# Patient Record
Sex: Female | Born: 2000 | Race: White | Hispanic: No | Marital: Single | Smoking: Current every day smoker
Health system: Southern US, Community
[De-identification: ages and names within clinical notes are randomized; demographics above are authoritative.]

## PROBLEM LIST (undated history)

## (undated) DIAGNOSIS — N83209 Unspecified ovarian cyst, unspecified side: Secondary | ICD-10-CM

## (undated) DIAGNOSIS — N200 Calculus of kidney: Secondary | ICD-10-CM

---
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
## 2019-08-29 IMAGING — CT CT ABD-PELV W/ CM
2 of 4 series · 17 of 46 positions shown, 19 images · IV contrast (omnipaque)
Comparison: None.

CLINICAL DATA: 17-year-old female with bloody stool, abdominal
pain, and nausea.

EXAM:
CT ABDOMEN AND PELVIS WITH CONTRAST
TECHNIQUE: Multidetector CT imaging of the abdomen and pelvis was performed
using the standard protocol following bolus administration of
intravenous contrast.
CONTRAST:  100mL OMNIPAQUE IOHEXOL 300 MG/ML  SOLN

[Series 3: a/p w/ 5mm · axial · 0.98mm/px · z∈[+797,+1307]mm · 14 of 112 slices shown, 16 images]
[im 5/112  soft-tissue]
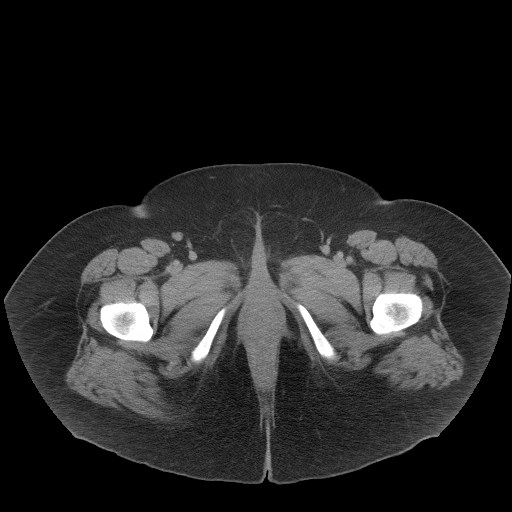
[im 5/112  bone]
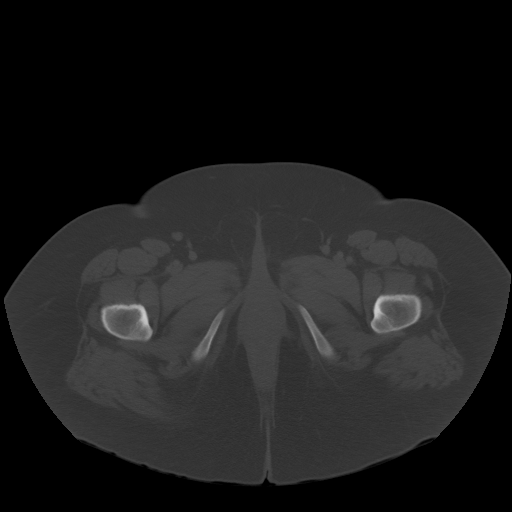
[im 13/112  soft-tissue]
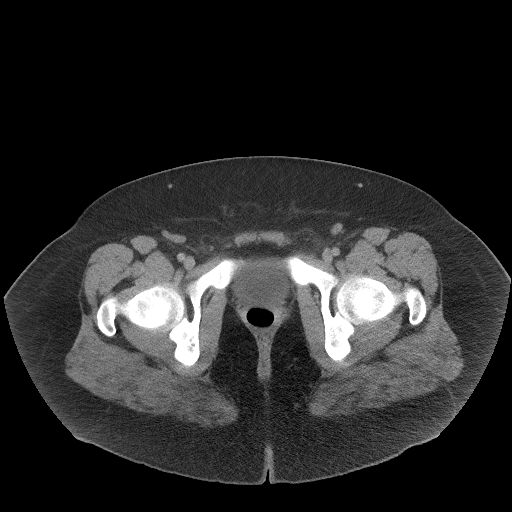
[im 22/112  soft-tissue]
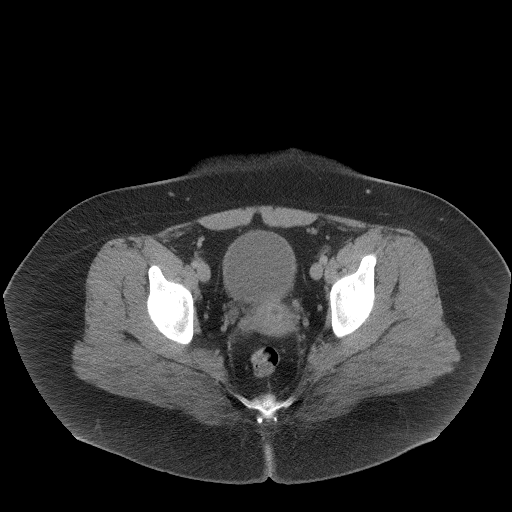
[im 30/112  soft-tissue]
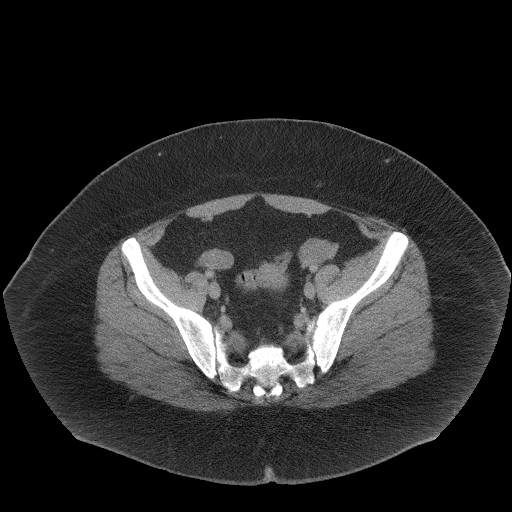
[im 39/112  soft-tissue]
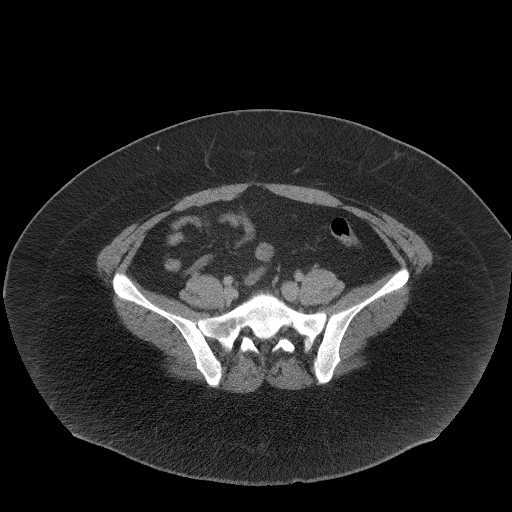
[im 43/112  soft-tissue]
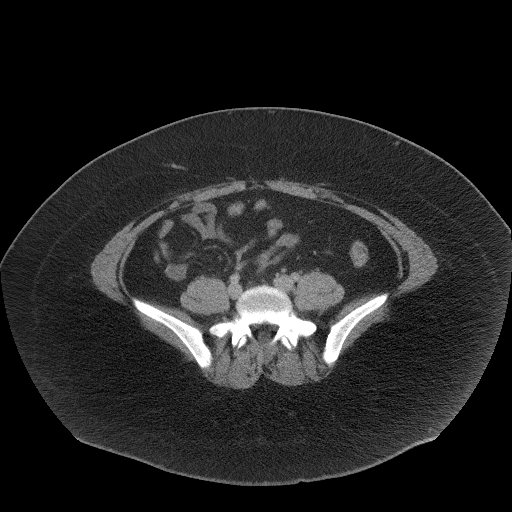
[im 52/112  soft-tissue]
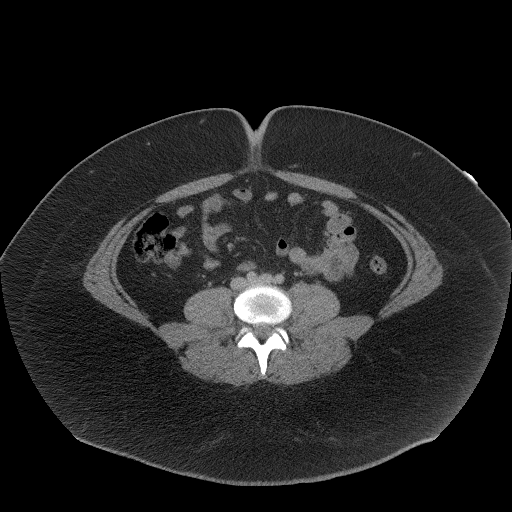
[im 60/112  soft-tissue]
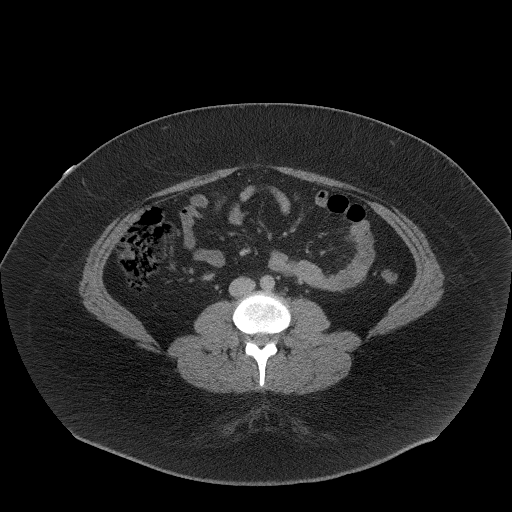
[im 69/112  soft-tissue]
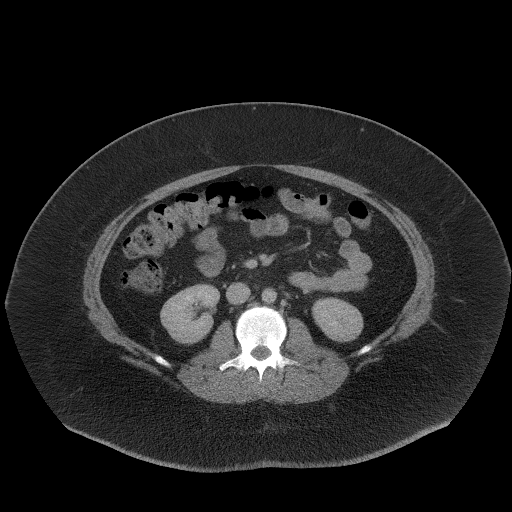
[im 69/112  bone]
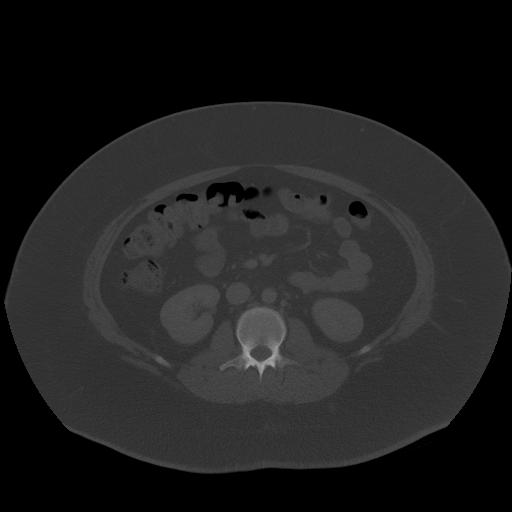
[im 73/112  soft-tissue]
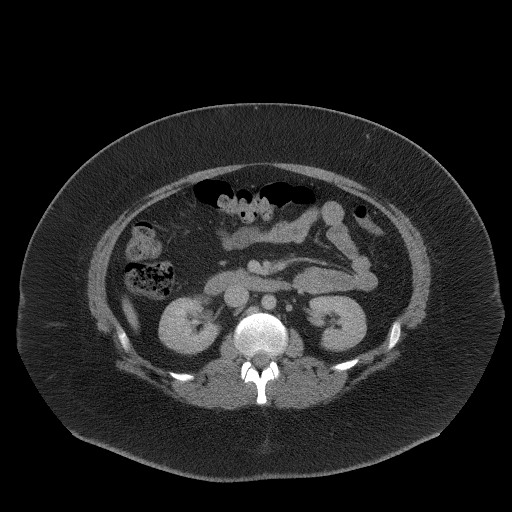
[im 82/112  soft-tissue]
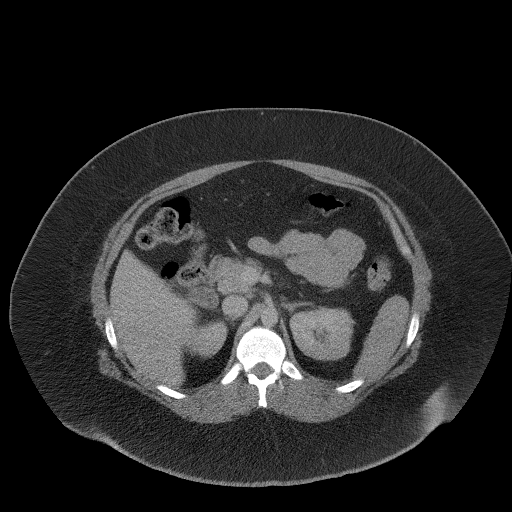
[im 90/112  soft-tissue]
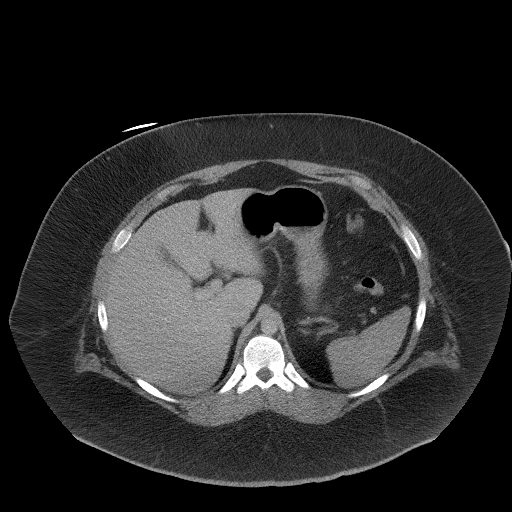
[im 99/112  soft-tissue]
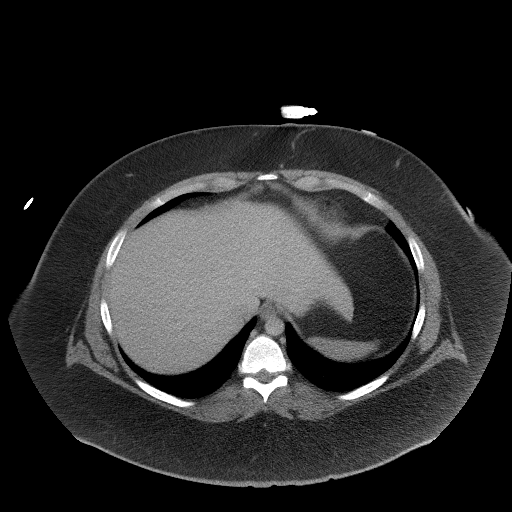
[im 107/112  soft-tissue]
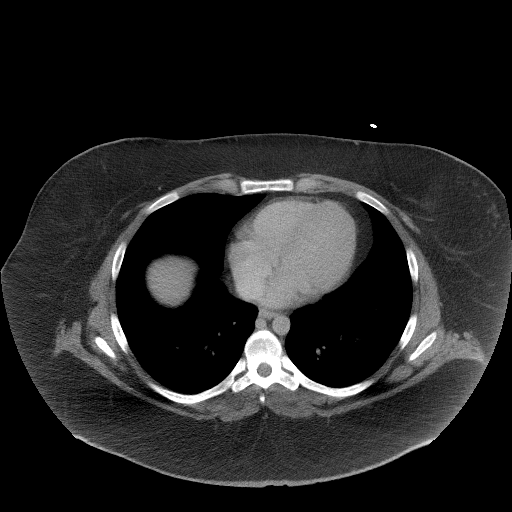

[Series 6: a/p w/ cor · coronal · 0.99mm/px · 3 of 162 slices shown]
[im 54/162  soft-tissue]
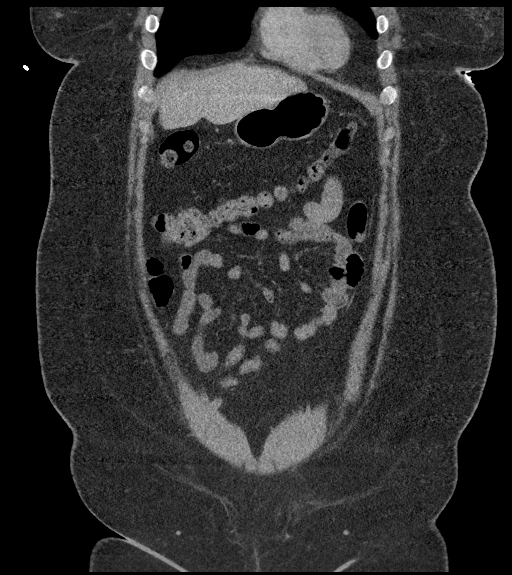
[im 72/162  soft-tissue]
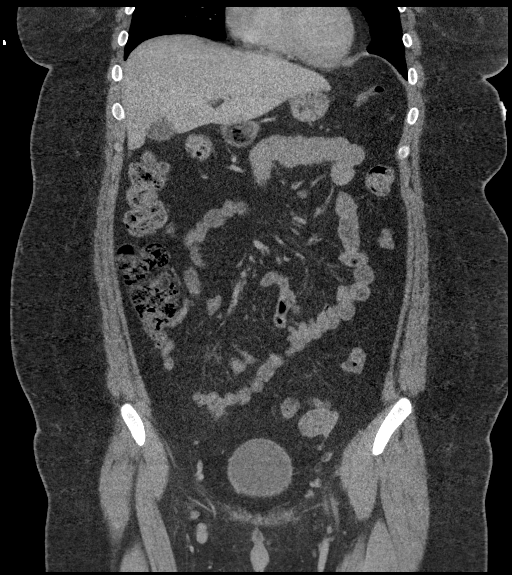
[im 90/162  soft-tissue]
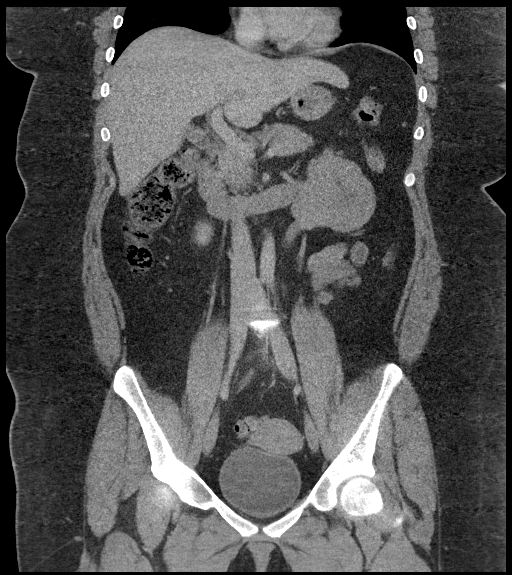

[17 of 46 positions shown; findings below may reference images not displayed]

FINDINGS: Lower chest: The visualized lung bases are clear.

No intra-abdominal free air or free fluid.

Hepatobiliary: Possible mild fatty infiltration of the liver. No
intrahepatic biliary ductal dilatation. There is a 1 cm stone in the
neck of the gallbladder. No pericholecystic fluid.

Pancreas: Unremarkable. No pancreatic ductal dilatation or
surrounding inflammatory changes.

Spleen: Normal in size without focal abnormality.

Adrenals/Urinary Tract: Adrenal glands are unremarkable. Kidneys are
normal, without renal calculi, focal lesion, or hydronephrosis.
Bladder is unremarkable.

Stomach/Bowel: Stomach is within normal limits. Appendix appears
normal. No evidence of bowel wall thickening, distention, or
inflammatory changes.

Vascular/Lymphatic: No significant vascular findings are present. No
enlarged abdominal or pelvic lymph nodes.

Reproductive: The uterus is anteverted. The ovaries are grossly
unremarkable. A tampon is noted in the vagina.

Other: None

Musculoskeletal: No acute or significant osseous findings.
IMPRESSION: 1. No acute intra-abdominal or pelvic pathology.
2. Cholelithiasis and probable mild fatty liver.
